# Patient Record
Sex: Female | Born: 2000 | Race: Black or African American | Hispanic: No | Marital: Single | Smoking: Never smoker
Health system: Southern US, Community
[De-identification: ages and names within clinical notes are randomized; demographics above are authoritative.]

---
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
## 2022-08-25 ENCOUNTER — Encounter (HOSPITAL_COMMUNITY): Payer: Self-pay

## 2022-08-25 ENCOUNTER — Emergency Department (HOSPITAL_COMMUNITY): Payer: BC Managed Care – PPO

## 2022-08-25 ENCOUNTER — Other Ambulatory Visit: Payer: Self-pay

## 2022-08-25 ENCOUNTER — Emergency Department (HOSPITAL_COMMUNITY)
Admission: EM | Admit: 2022-08-25 | Discharge: 2022-08-25 | Disposition: A | Discharge status: Home / Self Care | Payer: BC Managed Care – PPO | Attending: Emergency Medicine | Admitting: Emergency Medicine

## 2022-08-25 DIAGNOSIS — J189 Pneumonia, unspecified organism: Secondary | ICD-10-CM | POA: Diagnosis not present

## 2022-08-25 DIAGNOSIS — R079 Chest pain, unspecified: Secondary | ICD-10-CM | POA: Diagnosis present

## 2022-08-25 LAB — CBC
HCT: 36.2 % (ref 36.0–46.0)
Hemoglobin: 10.1 g/dL — ABNORMAL LOW (ref 12.0–15.0)
MCH: 18.9 pg — ABNORMAL LOW (ref 26.0–34.0)
MCHC: 27.9 g/dL — ABNORMAL LOW (ref 30.0–36.0)
MCV: 67.7 fL — ABNORMAL LOW (ref 80.0–100.0)
Platelets: 432 10*3/uL — ABNORMAL HIGH (ref 150–400)
RBC: 5.35 MIL/uL — ABNORMAL HIGH (ref 3.87–5.11)
RDW: 17.1 % — ABNORMAL HIGH (ref 11.5–15.5)
WBC: 10.8 10*3/uL — ABNORMAL HIGH (ref 4.0–10.5)
nRBC: 0 % (ref 0.0–0.2)

## 2022-08-25 LAB — BASIC METABOLIC PANEL
Anion gap: 11 (ref 5–15)
BUN: 11 mg/dL (ref 6–20)
CO2: 26 mmol/L (ref 22–32)
Calcium: 9 mg/dL (ref 8.9–10.3)
Chloride: 100 mmol/L (ref 98–111)
Creatinine, Ser: 0.69 mg/dL (ref 0.44–1.00)
GFR, Estimated: >60 mL/min (ref >60)
Glucose, Bld: 95 mg/dL (ref 70–99)
Potassium: 3.6 mmol/L (ref 3.5–5.1)
Sodium: 137 mmol/L (ref 135–145)

## 2022-08-25 LAB — I-STAT BETA HCG BLOOD, ED (MC, WL, AP ONLY): I-stat hCG, quantitative: <5 m[IU]/mL (ref <5)

## 2022-08-25 LAB — TROPONIN I (HIGH SENSITIVITY)
Troponin I (High Sensitivity): 4 ng/L (ref <18)
Troponin I (High Sensitivity): 4 ng/L (ref <18)

## 2022-08-25 LAB — D-DIMER, QUANTITATIVE: D-Dimer, Quant: 0.68 ug/mL-FEU — ABNORMAL HIGH (ref 0.00–0.50)

## 2022-08-25 MED ORDER — AZITHROMYCIN 250 MG PO TABS: 250.0000 mg | ORAL_TABLET | Freq: Every day | ORAL | 0 refills | Status: AC

## 2022-08-25 MED ORDER — ALBUTEROL SULFATE HFA 108 (90 BASE) MCG/ACT IN AERS
2.0000 | INHALATION_SPRAY | Freq: Once | RESPIRATORY_TRACT | Status: AC
  Administered 2022-08-25: 2 via RESPIRATORY_TRACT
  Filled 2022-08-25: qty 6.7

## 2022-08-25 MED ORDER — DEXAMETHASONE SODIUM PHOSPHATE 10 MG/ML IJ SOLN
10.0000 mg | Freq: Once | INTRAMUSCULAR | Status: AC
  Administered 2022-08-25: 10 mg via INTRAVENOUS
  Filled 2022-08-25: qty 1

## 2022-08-25 MED ORDER — AZITHROMYCIN 250 MG PO TABS
500.0000 mg | ORAL_TABLET | Freq: Once | ORAL | Status: AC
Start: 2022-08-25 — End: 2022-08-25
  Administered 2022-08-25: 500 mg via ORAL
  Filled 2022-08-25: qty 2

## 2022-08-25 MED ORDER — IPRATROPIUM-ALBUTEROL 0.5-2.5 (3) MG/3ML IN SOLN: 3.0000 mL | RESPIRATORY_TRACT | Status: DC | PRN

## 2022-08-25 MED ORDER — IOHEXOL 350 MG/ML SOLN
75.0000 mL | Freq: Once | INTRAVENOUS | Status: AC | PRN
  Administered 2022-08-25: 75 mL via INTRAVENOUS

## 2022-08-25 MED ORDER — ACETAMINOPHEN 500 MG PO TABS
1000.0000 mg | ORAL_TABLET | Freq: Once | ORAL | Status: AC
  Administered 2022-08-25: 1000 mg via ORAL
  Filled 2022-08-25: qty 2

## 2022-08-25 NOTE — ED Provider Notes (Signed)
Beverly Ortega   CSN: [id number] Arrival date & time: 08/25/22  1328     History Chief Complaint  Patient presents with   Chest Pain   Shortness of Breath   Sinus Congestion    HPI Beverly Ortega is a 22 y.o. female presenting for chief complaint of chest pain shortness of breath and sinus congestion.  States she was woken up at 2 AM by sharp left-sided chest pain and dyspnea.  She states that has resolved in the interim but was quite uncomfortable last night.  She is a minimal medical history is otherwise ambulatory tolerating p.o. intake.  Does not take any medications on a normal day.  Does not use birth control.  States it was pleuritic in nature this morning. Family is at bedside and states that she has had audible wheezing over the last 2 days with no history of asthma Norse history of similar wheezing. Patient's recorded medical, surgical, social, medication list and allergies were reviewed in the Snapshot window as part of the initial history.   Review of Systems   Review of Systems  Constitutional:  Negative for chills and fever.  HENT:  Negative for ear pain and sore throat.   Eyes:  Negative for pain and visual disturbance.  Respiratory:  Positive for shortness of breath. Negative for cough.   Cardiovascular:  Positive for chest pain. Negative for palpitations.  Gastrointestinal:  Negative for abdominal pain and vomiting.  Genitourinary:  Negative for dysuria and hematuria.  Musculoskeletal:  Negative for arthralgias and back pain.  Skin:  Negative for color change and rash.  Neurological:  Negative for seizures and syncope.  All other systems reviewed and are negative.   Physical Exam Updated Vital Signs BP 110/78   Pulse 81   Temp 99 F (37.2 C) (Oral)   Resp 17   Ht 5\' 4"  (1.626 m)   Wt 131.5 kg   SpO2 98%   BMI 49.78 kg/m  Physical Exam Vitals and nursing Ortega reviewed.  Constitutional:       General: She is not in acute distress.    Appearance: She is well-developed.  HENT:     Head: Normocephalic and atraumatic.  Eyes:     Conjunctiva/sclera: Conjunctivae normal.  Cardiovascular:     Rate and Rhythm: Normal rate and regular rhythm.     Heart sounds: No murmur heard. Pulmonary:     Effort: Pulmonary effort is normal. No respiratory distress.     Breath sounds: Wheezing present.  Abdominal:     General: There is no distension.     Palpations: Abdomen is soft.     Tenderness: There is no abdominal tenderness. There is no right CVA tenderness or left CVA tenderness.  Musculoskeletal:        General: No swelling or tenderness. Normal range of motion.     Cervical back: Neck supple.  Skin:    General: Skin is warm and dry.  Neurological:     General: No focal deficit present.     Mental Status: She is alert and oriented to person, place, and time. Mental status is at baseline.     Cranial Nerves: No cranial nerve deficit.      ED Course/ Medical Decision Making/ A&P    Procedures Procedures   Medications Ordered in ED Medications  ipratropium-albuterol (DUONEB) 0.5-2.5 (3) MG/3ML nebulizer solution 3 mL (has no administration in time range)  azithromycin (ZITHROMAX) tablet 500 mg (  has no administration in time range)  dexamethasone (DECADRON) injection 10 mg (10 mg Intravenous Given 08/25/22 1620)  acetaminophen (TYLENOL) tablet 1,000 mg (1,000 mg Oral Given 08/25/22 1620)  iohexol (OMNIPAQUE) 350 MG/ML injection 75 mL (75 mLs Intravenous Contrast Given 08/25/22 1746)  albuterol (VENTOLIN HFA) 108 (90 Base) MCG/ACT inhaler 2 puff (2 puffs Inhalation Given 08/25/22 1925)   Medical Decision Making: Beverly Ortega is a 22 y.o. female who presented to the ED today with chest pain, detailed above.  Based on patient's comorbidities, patient has a heart score of 2.    Additional history discussed with patient's family/caregivers.  Patient placed on continuous vitals and  telemetry monitoring while in ED which was reviewed periodically.  Complete initial physical exam performed, notably the patient was HDS in NAD.   Reviewed and confirmed nursing documentation for past medical history, family history, social history.    Initial Assessment: With the patient's presentation of left-sided chest pain, most likely diagnosis is musculoskeletal chest pain versus GERD, although ACS remains on the differential. Other diagnoses were considered including (but not limited to) pulmonary embolism, community-acquired pneumonia, aortic dissection, pneumothorax, underlying bony abnormality, anemia. These are considered less likely due to history of present illness and physical exam findings.    In particular, concerning pulmonary embolism: Patient is PERC + for tachycardia and the they deny malignancy, recent surgery, history of DVT, or calf tenderness leading to a low risk Wells score. Aortic Dissection also reconsidered but seems less likely based on the location, quality, onset, and severity of symptoms in this case. Patient has a lack of serious comorbidities for this condition including a lack of HTN or Smoking. Patient also has a lack of underlying history of AD or TAA.  This is most consistent with an acute life/limb threatening illness complicated by underlying chronic conditions.   Initial Plan: Evaluate for ACS with delta troponin and EKG evaluated as below  Evaluate for dissection, bony abnormality, or pneumonia with chest x-ray and screening laboratory evaluation including CBC, BMP  Further evaluation for pulmonary embolism is indicated at this time based on patient's PERC and Wells score.  Will start with a D-dimer due to low Wells score and will escalate to a CT if indicated.   Further evaluation for Thoracic Aortic Dissection not indicated at this time based on patient's clinical history and PE findings.   Initial Study Results: EKG was reviewed independently. Rate,  rhythm, axis, intervals all examined and without medically relevant abnormality. ST segments without concerns for elevations.    Laboratory  Delta troponin demonstrated NAA   CBC and BMP without obvious metabolic or inflammatory abnormalities requiring further evaluation   Radiology  DG Chest 2 View  Result Date: 08/25/2022 CLINICAL DATA:  Chest pain and shortness of breath. EXAM: CHEST - 2 VIEW COMPARISON:  None Available. FINDINGS: Cardiac silhouette and mediastinal contours are within normal limits. The lungs are clear. No pleural effusion or pneumothorax. No acute skeletal abnormality. IMPRESSION: No active cardiopulmonary disease. Electronically Signed   By: Neita Garnet M.D.   On: 08/25/2022 14:45    Final Assessment and Plan: CTA without PE. Wheezing BL with apical abnormalities concerning on CTA concerning for atypical pulmonary infection. Recommended azithromycin and treatment for asthma Disposition:  I have considered need for hospitalization, however, considering all of the above, I believe this patient is stable for discharge at this time.  Patient/family educated about specific return precautions for given chief complaint and symptoms.  Patient/family educated about follow-up  with PCP.     Patient/family expressed understanding of return precautions and need for follow-up. Patient spoken to regarding all imaging and laboratory results and appropriate follow up for these results. All education provided in verbal form with additional information in written form. Time was allowed for answering of patient questions. Patient discharged.    Emergency Department Medication Summary:   Medications  ipratropium-albuterol (DUONEB) 0.5-2.5 (3) MG/3ML nebulizer solution 3 mL (has no administration in time range)  azithromycin (ZITHROMAX) tablet 500 mg (has no administration in time range)  dexamethasone (DECADRON) injection 10 mg (10 mg Intravenous Given 08/25/22 1620)  acetaminophen  (TYLENOL) tablet 1,000 mg (1,000 mg Oral Given 08/25/22 1620)  iohexol (OMNIPAQUE) 350 MG/ML injection 75 mL (75 mLs Intravenous Contrast Given 08/25/22 1746)  albuterol (VENTOLIN HFA) 108 (90 Base) MCG/ACT inhaler 2 puff (2 puffs Inhalation Given 08/25/22 1925)      Clinical Impression:  1. Walking pneumonia      Discharge   Final Clinical Impression(s) / ED Diagnoses Final diagnoses:  Walking pneumonia    Rx / DC Orders ED Discharge Orders          Ordered    azithromycin (ZITHROMAX) 250 MG tablet  Daily        08/25/22 2034              Glyn Ade, MD 08/25/22 2038

## 2022-08-25 NOTE — ED Triage Notes (Signed)
Pt came in via POV d/t Lt sided CP rated 3/10, denies radiation. Has also been feeling SOB, diaphoretic & having sinus congestion lately. A/Ox4, rated her pain 3/10 while in triage.
